# Patient Record
Sex: Male | Born: 2000 | Race: White | Hispanic: No | Marital: Single | Smoking: Never smoker
Health system: Southern US, Community
[De-identification: ages and names within clinical notes are randomized; demographics above are authoritative.]

## PROBLEM LIST (undated history)

## (undated) HISTORY — PX: APPENDECTOMY: SHX54

---
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
## 2021-06-28 ENCOUNTER — Other Ambulatory Visit: Payer: Self-pay

## 2021-06-28 ENCOUNTER — Encounter: Payer: Self-pay | Admitting: Family Medicine

## 2021-06-28 ENCOUNTER — Ambulatory Visit (INDEPENDENT_AMBULATORY_CARE_PROVIDER_SITE_OTHER): Payer: 59 | Admitting: Family Medicine

## 2021-06-28 VITALS — BP 106/68 | HR 83 | Ht 72.0 in | Wt 272.0 lb

## 2021-06-28 DIAGNOSIS — E7800 Pure hypercholesterolemia, unspecified: Secondary | ICD-10-CM

## 2021-06-28 DIAGNOSIS — I10 Essential (primary) hypertension: Secondary | ICD-10-CM | POA: Diagnosis not present

## 2021-06-28 DIAGNOSIS — Z0001 Encounter for general adult medical examination with abnormal findings: Secondary | ICD-10-CM | POA: Diagnosis not present

## 2021-06-28 DIAGNOSIS — Z0000 Encounter for general adult medical examination without abnormal findings: Secondary | ICD-10-CM

## 2021-06-28 NOTE — Progress Notes (Signed)
BP (!) 144/83   Pulse 83   Ht 6' (1.829 m)   Wt 272 lb (123.4 kg)   SpO2 98%   BMI 36.89 kg/m    Subjective:   Patient ID: Victor Morris, male    DOB: June 08, 2001, 20 y.o.   MRN: [id number]  HPI: Victor Morris is a 20 y.o. male presenting on 06/28/2021 for No chief complaint on file.   HPI Adult well exam and physical Patient denies any chest pain, shortness of breath, or vision issues, abdominal complaints, diarrhea, nausea, vomiting, or joint issues.  He does have occasional headaches and does not know if it was related to blood pressure, he is trying to exercise through reduce weight and to be more physically active and is started doing treadmill and lifting weights  Hypertension Patient is currently on no medication and we are monitoring, and their blood pressure today is 144/83 and then on repeat it was 106/68.Marland Kitchen Patient denies any lightheadedness or dizziness. Patient denies blurred vision, chest pains, shortness of breath, or weakness. Denies any side effects from medication and is content with current medication.   Hyperlipidemia Patient is coming in for recheck of his hyperlipidemia. The patient is currently taking no medication and has been trying to do diet control and exercise.  He has gained some over the pandemic and is trying get it back down.. They deny any issues with myalgias or history of liver damage from it. They deny any focal numbness or weakness or chest pain.   Relevant past medical, surgical, family and social history reviewed and updated as indicated. Interim medical history since our last visit reviewed. Allergies and medications reviewed and updated.  Review of Systems  Constitutional:  Negative for chills and fever.  HENT:  Negative for ear pain and tinnitus.   Eyes:  Negative for pain.  Respiratory:  Negative for cough, shortness of breath and wheezing.   Cardiovascular:  Negative for chest pain, palpitations and leg swelling.   Gastrointestinal:  Negative for abdominal pain, blood in stool, constipation and diarrhea.  Genitourinary:  Negative for dysuria and hematuria.  Musculoskeletal:  Negative for back pain, gait problem and myalgias.  Skin:  Negative for rash.  Neurological:  Positive for headaches (Occasionally). Negative for dizziness, weakness and light-headedness.  Psychiatric/Behavioral:  Negative for suicidal ideas.   All other systems reviewed and are negative.  Per HPI unless specifically indicated above   Allergies as of 06/28/2021   No Known Allergies      Medication List    as of June 28, 2021  2:54 PM   You have not been prescribed any medications.      Objective:   BP (!) 144/83   Pulse 83   Ht 6' (1.829 m)   Wt 272 lb (123.4 kg)   SpO2 98%   BMI 36.89 kg/m   Wt Readings from Last 3 Encounters:  06/28/21 272 lb (123.4 kg) (>99 %, Z= 2.73)*  04/06/20 269 lb (122 kg) (>99 %, Z= 2.71)*  06/11/19 251 lb 3.2 oz (113.9 kg) (>99 %, Z= 2.58)*   * Growth percentiles are based on CDC (Boys, 2-20 Years) data.    Physical Exam Vitals and nursing note reviewed.  Constitutional:      General: He is not in acute distress.    Appearance: He is well-developed. He is not diaphoretic.  HENT:     Right Ear: External ear normal.     Left Ear: External ear normal.  Nose: Nose normal.     Mouth/Throat:     Pharynx: No oropharyngeal exudate.  Eyes:     General: No scleral icterus.       Right eye: No discharge.     Conjunctiva/sclera: Conjunctivae normal.     Pupils: Pupils are equal, round, and reactive to light.  Neck:     Thyroid: No thyromegaly.  Cardiovascular:     Rate and Rhythm: Normal rate and regular rhythm.     Heart sounds: Normal heart sounds. No murmur heard. Pulmonary:     Effort: Pulmonary effort is normal. No respiratory distress.     Breath sounds: Normal breath sounds. No wheezing.  Abdominal:     General: Bowel sounds are normal. There is no distension.      Palpations: Abdomen is soft.     Tenderness: There is no abdominal tenderness. There is no guarding or rebound.  Musculoskeletal:        General: Normal range of motion.     Cervical back: Neck supple.  Lymphadenopathy:     Cervical: No cervical adenopathy.  Skin:    General: Skin is warm and dry.     Findings: No rash.  Neurological:     Mental Status: He is alert and oriented to person, place, and time.     Coordination: Coordination normal.  Psychiatric:        Behavior: Behavior normal.      Assessment & Plan:   Problem List Items Addressed This Visit       Cardiovascular and Mediastinum   Hypertension   Other Visit Diagnoses     Well adult exam    -  Primary   Relevant Orders   CBC with Differential/Platelet   CMP14+EGFR   Lipid panel   HIV Antibody (routine testing w rflx)   Hepatitis C antibody   Pure hypercholesterolemia           Continue to focus on diet and exercise, will check blood work and see how he is doing. Follow up plan: No follow-ups on file.  Counseling provided for all of the vaccine components No orders of the defined types were placed in this encounter.   Caryl Pina, MD [facility] 06/28/2021, 2:54 PM

## 2021-06-29 LAB — CMP14+EGFR
ALT: 33 IU/L (ref 0–44)
AST: 22 IU/L (ref 0–40)
Albumin/Globulin Ratio: 1.9 (ref 1.2–2.2)
Albumin: 4.8 g/dL (ref 4.1–5.2)
Alkaline Phosphatase: 103 IU/L (ref 51–125)
BUN/Creatinine Ratio: 11 (ref 9–20)
BUN: 13 mg/dL (ref 6–20)
Bilirubin Total: 1 mg/dL (ref 0.0–1.2)
CO2: 27 mmol/L (ref 20–29)
Calcium: 10.1 mg/dL (ref 8.7–10.2)
Chloride: 101 mmol/L (ref 96–106)
Creatinine, Ser: 1.21 mg/dL (ref 0.76–1.27)
Globulin, Total: 2.5 g/dL (ref 1.5–4.5)
Glucose: 75 mg/dL (ref 65–99)
Potassium: 4.5 mmol/L (ref 3.5–5.2)
Sodium: 142 mmol/L (ref 134–144)
Total Protein: 7.3 g/dL (ref 6.0–8.5)
eGFR: 88 mL/min/{1.73_m2} (ref >59)

## 2021-06-29 LAB — CBC WITH DIFFERENTIAL/PLATELET
Basophils Absolute: 0 10*3/uL (ref 0.0–0.2)
Basos: 0 %
EOS (ABSOLUTE): 0.1 10*3/uL (ref 0.0–0.4)
Eos: 1 %
Hematocrit: 51.4 % — ABNORMAL HIGH (ref 37.5–51.0)
Hemoglobin: 17.3 g/dL (ref 13.0–17.7)
Immature Grans (Abs): 0.1 10*3/uL (ref 0.0–0.1)
Immature Granulocytes: 1 %
Lymphocytes Absolute: 2.5 10*3/uL (ref 0.7–3.1)
Lymphs: 23 %
MCH: 30 pg (ref 26.6–33.0)
MCHC: 33.7 g/dL (ref 31.5–35.7)
MCV: 89 fL (ref 79–97)
Monocytes Absolute: 0.9 10*3/uL (ref 0.1–0.9)
Monocytes: 8 %
Neutrophils Absolute: 6.9 10*3/uL (ref 1.4–7.0)
Neutrophils: 67 %
Platelets: 334 10*3/uL (ref 150–450)
RBC: 5.76 x10E6/uL (ref 4.14–5.80)
RDW: 12.7 % (ref 11.6–15.4)
WBC: 10.5 10*3/uL (ref 3.4–10.8)

## 2021-06-29 LAB — HIV ANTIBODY (ROUTINE TESTING W REFLEX): HIV Screen 4th Generation wRfx: NONREACTIVE

## 2021-06-29 LAB — LIPID PANEL
Chol/HDL Ratio: 6.2 ratio — ABNORMAL HIGH (ref 0.0–5.0)
Cholesterol, Total: 204 mg/dL — ABNORMAL HIGH (ref 100–169)
HDL: 33 mg/dL — ABNORMAL LOW (ref >39)
LDL Chol Calc (NIH): 138 mg/dL — ABNORMAL HIGH (ref 0–109)
Triglycerides: 180 mg/dL — ABNORMAL HIGH (ref 0–89)
VLDL Cholesterol Cal: 33 mg/dL (ref 5–40)

## 2021-06-29 LAB — HEPATITIS C ANTIBODY: Hep C Virus Ab: <0.1 s/co ratio (ref 0.0–0.9)

## 2021-06-30 IMAGING — US ULTRASOUND SCROTUM DOPPLER COMPLETE
1 series · 14 of 25 positions shown · non-contrast
Comparison: None.

CLINICAL DATA: Absent right testicle on physical examination.

EXAM:
SCROTAL ULTRASOUND
DOPPLER ULTRASOUND OF THE TESTICLES
TECHNIQUE: Complete ultrasound examination of the testicles, epididymis, and
other scrotal structures was performed. Color and spectral Doppler
ultrasound were also utilized to evaluate blood flow to the
testicles.

[Series 1: ultrasound scrotum doppler complete · 14 of 63 slices shown]
[im 1/63]
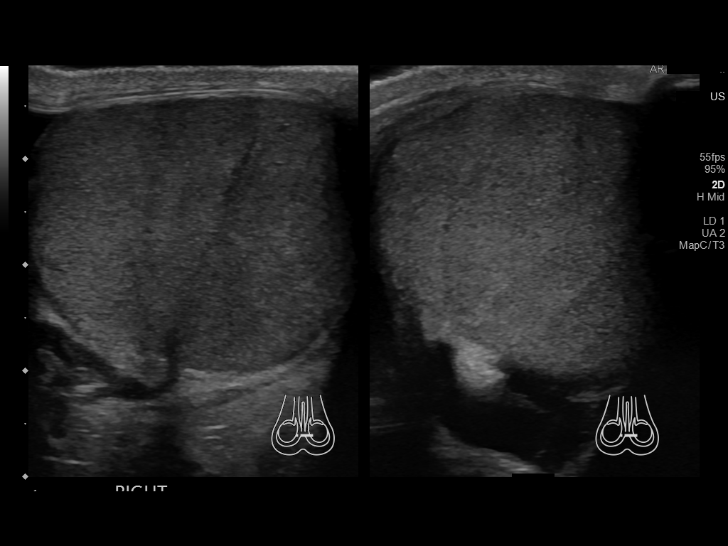
[im 6/63]
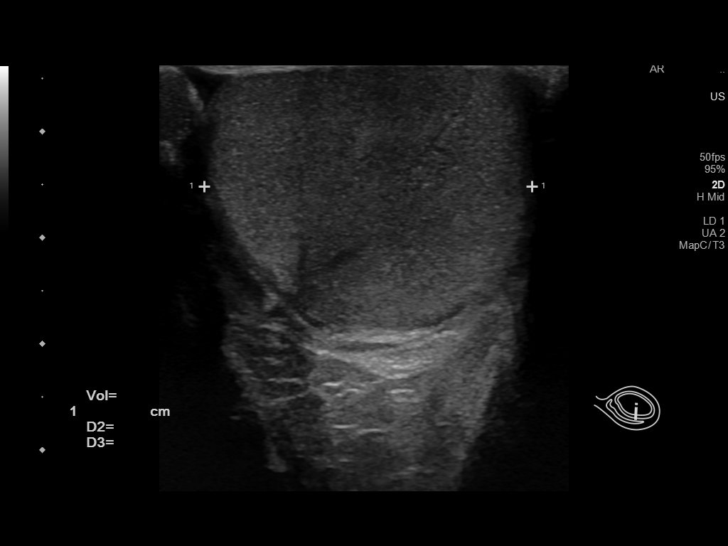
[im 11/63]
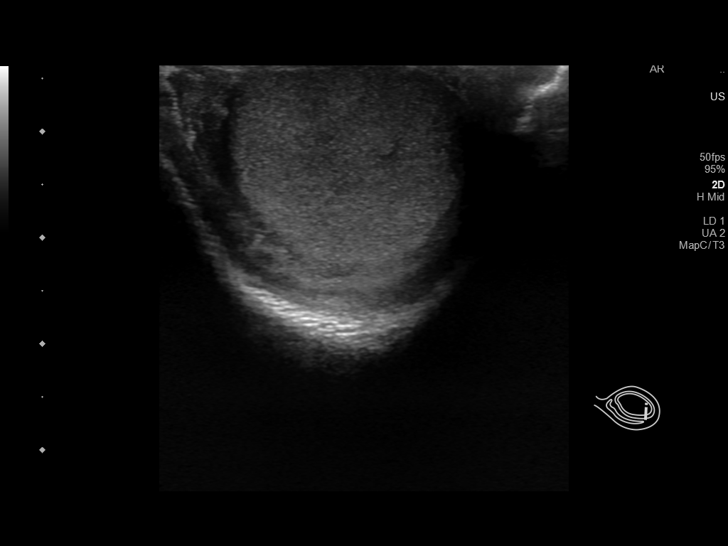
[im 16/63]
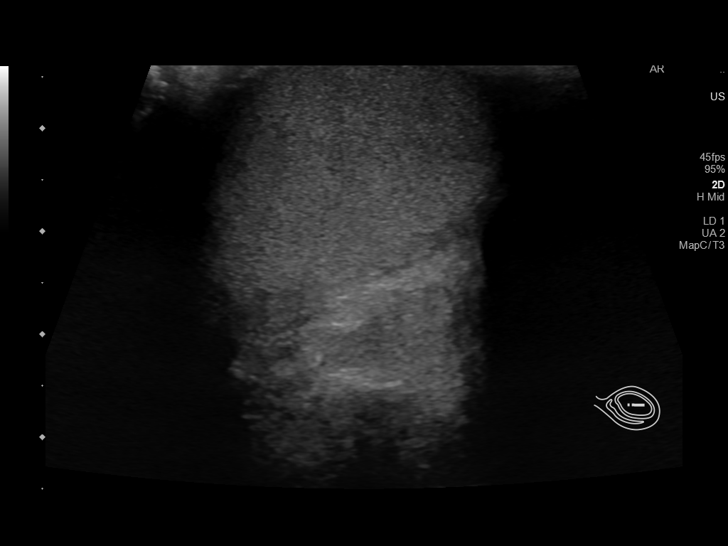
[im 21/63]
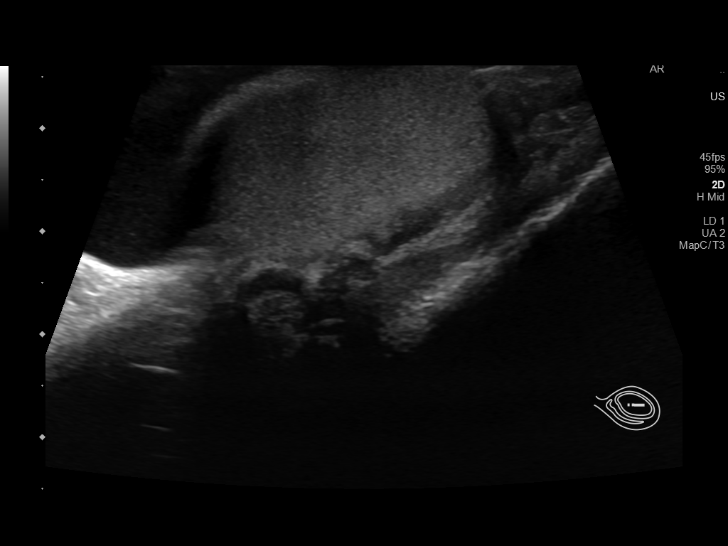
[im 24/63]
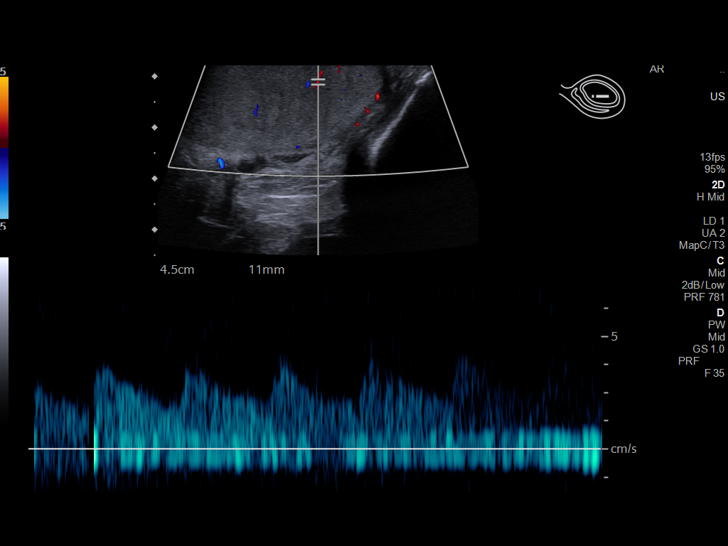
[im 29/63]
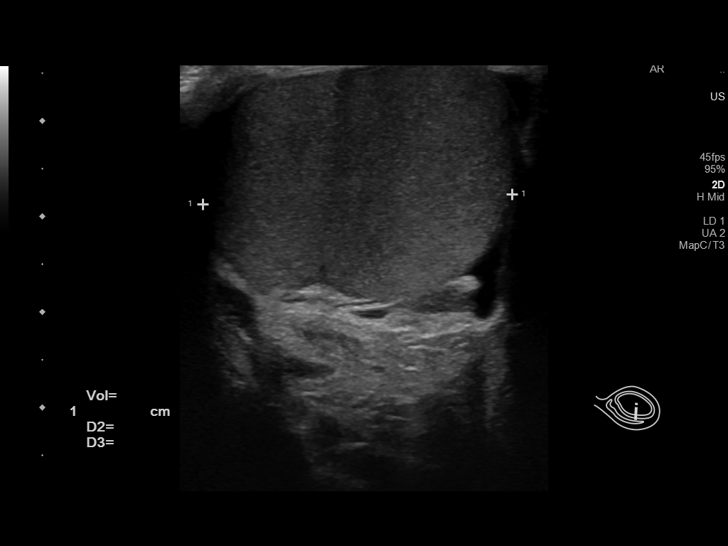
[im 34/63]
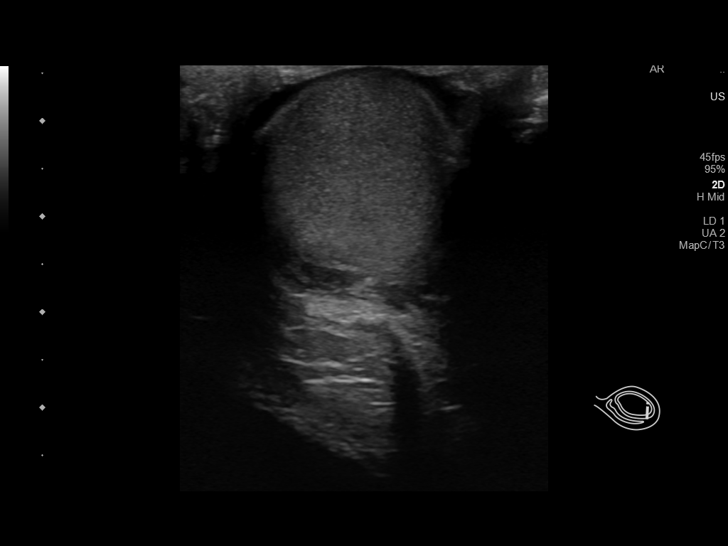
[im 39/63]
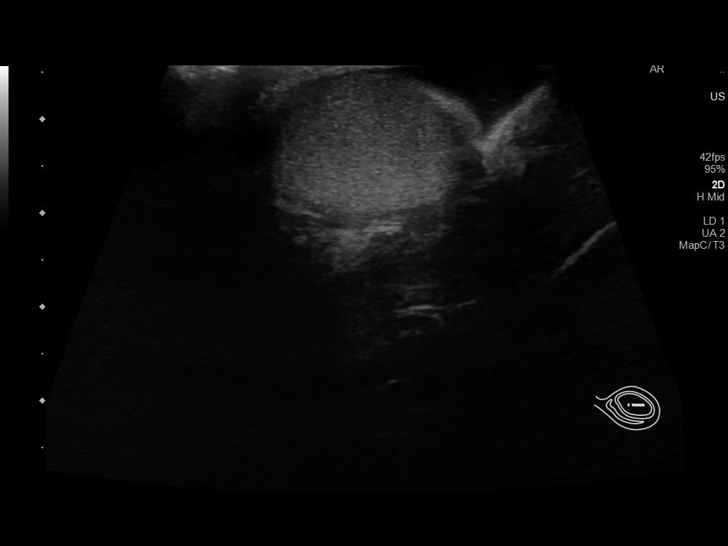
[im 42/63]
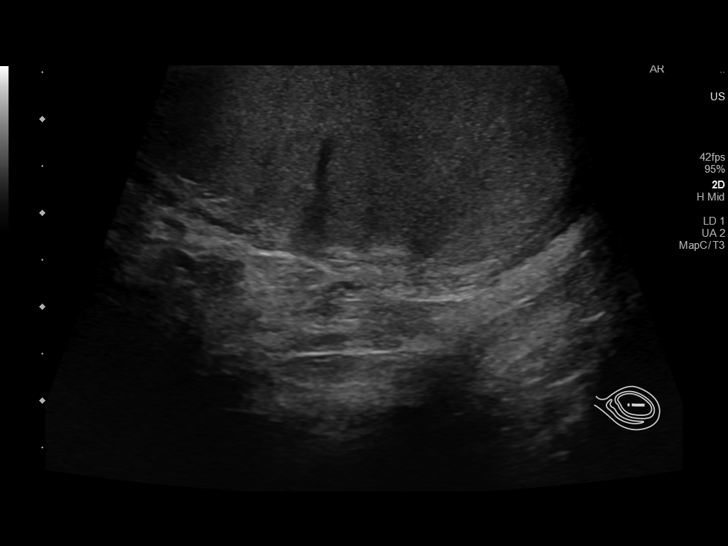
[im 47/63]
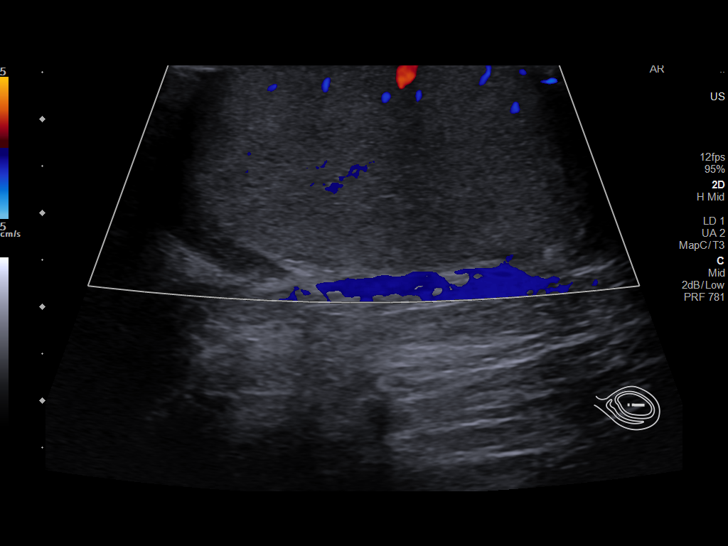
[im 52/63]
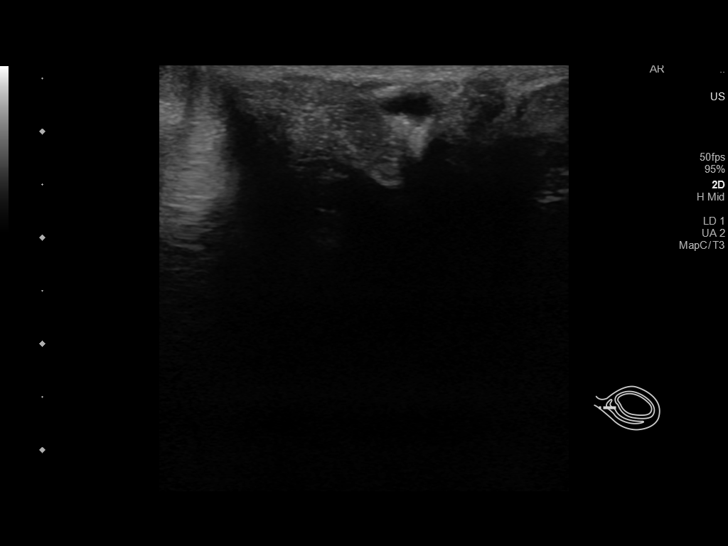
[im 57/63]
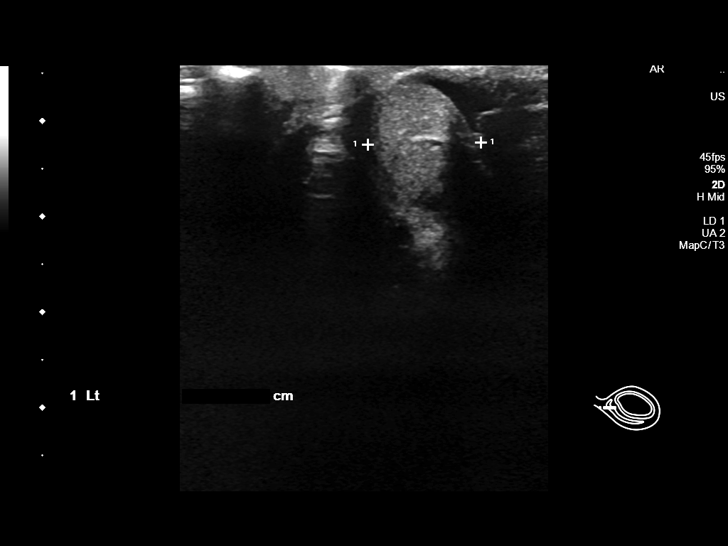
[im 63/63]
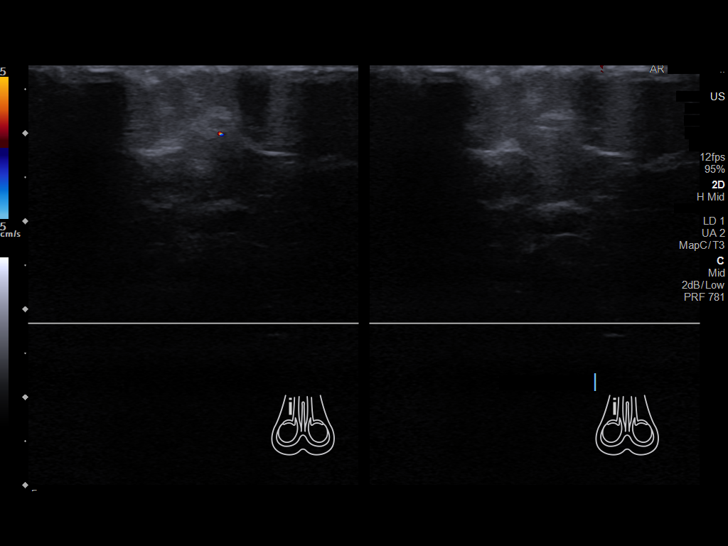

[14 of 25 positions shown; findings below may reference images not displayed]

FINDINGS: Right testicle

Measurements: 4.9 x 2.6 x 3.1 cm. No mass or microlithiasis
visualized. Right testis appears normally positioned within the
scrotal sac.

Left testicle

Measurements: 4.9 x 2.5 x 3.2 cm. No mass or microlithiasis
visualized.

Right epididymis:  Normal in size and appearance.

Left epididymis:  Normal in size and appearance.

Hydrocele: Mild bilateral hydrocele, right slightly greater than
left.

Varicocele:  None visualized.

Pulsed Doppler interrogation of both testes demonstrates normal low
resistance arterial and venous waveforms bilaterally.
IMPRESSION: Normal appearance and positioning of both testes. No evidence of
testicular mass or torsion.

Small bilateral hydroceles, right slightly greater than left.

## 2021-07-04 NOTE — Progress Notes (Signed)
R/C about labs   

## 2021-07-11 ENCOUNTER — Telehealth: Payer: Self-pay | Admitting: Family Medicine

## 2021-07-11 NOTE — Telephone Encounter (Signed)
Pt has been informed of results and Dr. Dettinger's recommendations
